# Patient Record
Sex: Female | Born: 1995 | Race: Black or African American | Hispanic: No | Marital: Single | Smoking: Never smoker
Health system: Southern US, Community
[De-identification: ages and names within clinical notes are randomized; demographics above are authoritative.]

---

## 2017-08-15 ENCOUNTER — Emergency Department (HOSPITAL_COMMUNITY)
Admission: EM | Admit: 2017-08-15 | Discharge: 2017-08-16 | Disposition: A | Discharge status: Home / Self Care | Payer: Managed Care, Other (non HMO) | Attending: Emergency Medicine | Admitting: Emergency Medicine

## 2017-08-15 ENCOUNTER — Encounter (HOSPITAL_COMMUNITY): Payer: Self-pay | Admitting: Emergency Medicine

## 2017-08-15 DIAGNOSIS — R102 Pelvic and perineal pain: Secondary | ICD-10-CM | POA: Diagnosis present

## 2017-08-15 DIAGNOSIS — A599 Trichomoniasis, unspecified: Secondary | ICD-10-CM | POA: Insufficient documentation

## 2017-08-15 LAB — I-STAT BETA HCG BLOOD, ED (MC, WL, AP ONLY)

## 2017-08-15 MED ORDER — KETOROLAC TROMETHAMINE 15 MG/ML IJ SOLN
30.0000 mg | Freq: Once | INTRAMUSCULAR | Status: AC
  Administered 2017-08-16: 30 mg via INTRAMUSCULAR
  Filled 2017-08-15: qty 2

## 2017-08-15 NOTE — ED Triage Notes (Signed)
Pt states that she started her period today and is having severe left lower abd/pelvic pain. She states that she took, midol, tylenol, ibuprofen, and used heating pads with no relief of pain.

## 2017-08-15 NOTE — ED Notes (Signed)
Called x3 for triage with no answer 

## 2017-08-16 ENCOUNTER — Encounter (HOSPITAL_COMMUNITY): Payer: Self-pay

## 2017-08-16 ENCOUNTER — Emergency Department (HOSPITAL_COMMUNITY): Payer: Managed Care, Other (non HMO)

## 2017-08-16 ENCOUNTER — Emergency Department (HOSPITAL_COMMUNITY)
Admission: EM | Admit: 2017-08-16 | Discharge: 2017-08-16 | Discharge status: Against Medical Advice | Payer: Managed Care, Other (non HMO) | Source: Home / Self Care | Attending: Emergency Medicine | Admitting: Emergency Medicine

## 2017-08-16 ENCOUNTER — Other Ambulatory Visit: Payer: Self-pay

## 2017-08-16 DIAGNOSIS — R1030 Lower abdominal pain, unspecified: Secondary | ICD-10-CM | POA: Insufficient documentation

## 2017-08-16 DIAGNOSIS — R1032 Left lower quadrant pain: Principal | ICD-10-CM

## 2017-08-16 DIAGNOSIS — A599 Trichomoniasis, unspecified: Secondary | ICD-10-CM

## 2017-08-16 DIAGNOSIS — R1031 Right lower quadrant pain: Secondary | ICD-10-CM

## 2017-08-16 LAB — URINALYSIS, ROUTINE W REFLEX MICROSCOPIC
BILIRUBIN URINE: NEGATIVE
Glucose, UA: NEGATIVE mg/dL
HGB URINE DIPSTICK: NEGATIVE
KETONES UR: 20 mg/dL — AB
Leukocytes, UA: NEGATIVE
Nitrite: NEGATIVE
PROTEIN: NEGATIVE mg/dL
Specific Gravity, Urine: 1.023 (ref 1.005–1.030)
pH: 5 (ref 5.0–8.0)

## 2017-08-16 LAB — WET PREP, GENITAL
CLUE CELLS WET PREP: NONE SEEN
Sperm: NONE SEEN
Yeast Wet Prep HPF POC: NONE SEEN

## 2017-08-16 LAB — GC/CHLAMYDIA PROBE AMP (~~LOC~~) NOT AT ARMC
Chlamydia: NEGATIVE
Neisseria Gonorrhea: NEGATIVE

## 2017-08-16 MED ORDER — METRONIDAZOLE 500 MG PO TABS
2000.0000 mg | ORAL_TABLET | Freq: Once | ORAL | Status: AC
  Administered 2017-08-16: 2000 mg via ORAL
  Filled 2017-08-16: qty 4

## 2017-08-16 MED ORDER — ONDANSETRON 4 MG PO TBDP
8.0000 mg | ORAL_TABLET | Freq: Once | ORAL | Status: AC
  Administered 2017-08-16: 8 mg via ORAL
  Filled 2017-08-16: qty 2

## 2017-08-16 NOTE — ED Provider Notes (Signed)
[facility]   CSN: [id number] Arrival date & time: 08/15/17  2211     History   Chief Complaint Chief Complaint  Patient presents with  . Pelvic Pain    HPI Judy Contreras is a 22 y.o. female.  The history is provided by the patient and medical records. No language interpreter was used.  Pelvic Pain  Associated symptoms include abdominal pain.   Judy Contreras is a 22 y.o. female with no known PMH who presents to the Emergency Department complaining of left-sided crampy abdominal pain.  Patient reports pain is similar to her typical menstrual cycle cramping, however much more severe than usual.  She reports she typically takes Midol and pain subsides.  She took Tylenol, Midol and ibuprofen as well as tried a heating pad, all with no relief pain.  She did start her menstrual cycle about an hour prior to symptom onset.  Denies any nausea or vomiting.  No back pain, dysuria, urinary urgency/frequency or vaginal discharge.  No fever or chills.   History reviewed. No pertinent past medical history.  There are no active problems to display for this patient.   History reviewed. No pertinent surgical history.  OB History    No data available       Home Medications    Prior to Admission medications   Not on File    Family History No family history on file.  Social History Social History   Tobacco Use  . Smoking status: Never Smoker  . Smokeless tobacco: Never Used  Substance Use Topics  . Alcohol use: No    Frequency: Never  . Drug use: Yes    Frequency: 2.0 times per week    Types: Marijuana     Allergies   Patient has no known allergies.   Review of Systems Review of Systems  Gastrointestinal: Positive for abdominal pain. Negative for blood in stool, diarrhea, nausea and vomiting.  Genitourinary: Positive for pelvic pain and vaginal bleeding (On menstrual cycle). Negative for dysuria, frequency,  urgency and vaginal discharge.  All other systems reviewed and are negative.    Physical Exam Updated Vital Signs BP 105/67 (BP Location: Right Arm)   Pulse 75   Temp 98.4 F (36.9 C) (Oral)   Resp 16   Ht 5\' 1"  (1.549 m)   Wt 78 kg (172 lb)   LMP 08/15/2017   SpO2 99%   BMI 32.50 kg/m   Physical Exam  Constitutional: She is oriented to person, place, and time. She appears well-developed and well-nourished. No distress.  HENT:  Head: Normocephalic and atraumatic.  Cardiovascular: Normal rate, regular rhythm and normal heart sounds.  No murmur heard. Pulmonary/Chest: Effort normal and breath sounds normal. No respiratory distress.  Abdominal: Soft. Bowel sounds are normal. She exhibits no distension.  No abdominal or CVA tenderness.   Genitourinary:  Genitourinary Comments: Chaperone present for exam. + menstrual bleeding. No vaginal discharge appreciated, but limited given bleeding. No CMT. + right adnexal tenderness.  Neurological: She is alert and oriented to person, place, and time.  Skin: Skin is warm and dry.  Nursing note and vitals reviewed.    ED Treatments / Results  Labs (all labs ordered are listed, but only abnormal results are displayed) Labs Reviewed  WET PREP, GENITAL - Abnormal; Notable for the following components:      Result Value   Trich, Wet Prep PRESENT (*)    WBC, Wet Prep HPF POC MANY (*)  All other components within normal limits  URINALYSIS, ROUTINE W REFLEX MICROSCOPIC - Abnormal; Notable for the following components:   Ketones, ur 20 (*)    All other components within normal limits  I-STAT BETA HCG BLOOD, ED ([facility], [facility], [facility] ONLY)  GC/CHLAMYDIA PROBE AMP ([place]) NOT AT [facility]    EKG  EKG Interpretation None       Radiology US Transvaginal Non-ob  Result Date: 08/16/2017 CLINICAL DATA:  Lower abdominal/pelvic pain today. EXAM: TRANSABDOMINAL AND TRANSVAGINAL ULTRASOUND OF PELVIS DOPPLER ULTRASOUND OF OVARIES TECHNIQUE: Both  transabdominal and transvaginal ultrasound examinations of the pelvis were performed. Transabdominal technique was performed for global imaging of the pelvis including uterus, ovaries, adnexal regions, and pelvic cul-de-sac. It was necessary to proceed with endovaginal exam following the transabdominal exam to visualize the adnexa. Color and duplex Doppler ultrasound was utilized to evaluate blood flow to the ovaries. COMPARISON:  None. FINDINGS: Uterus Measurements: 6.4 x 3.1 x 3.6 cm. No fibroids or other mass visualized. Endometrium Thickness: 7.5 mm.  No focal abnormality visualized. Right ovary Measurements: 4.3 x 3.1 x 2.9 cm. Normal appearance/no adnexal mass. Left ovary Measurements: 3.7 x 2.4 x 2.7 cm. Normal appearance/no adnexal mass. Pulsed Doppler evaluation of both ovaries demonstrates normal low-resistance arterial and venous waveforms. Other findings Trace free fluid. IMPRESSION: Normal pelvic ultrasound. Electronically Signed   By: Awilda Metro M.D.   On: 08/16/2017 02:01   US Pelvis Complete  Result Date: 08/16/2017 CLINICAL DATA:  Lower abdominal/pelvic pain today. EXAM: TRANSABDOMINAL AND TRANSVAGINAL ULTRASOUND OF PELVIS DOPPLER ULTRASOUND OF OVARIES TECHNIQUE: Both transabdominal and transvaginal ultrasound examinations of the pelvis were performed. Transabdominal technique was performed for global imaging of the pelvis including uterus, ovaries, adnexal regions, and pelvic cul-de-sac. It was necessary to proceed with endovaginal exam following the transabdominal exam to visualize the adnexa. Color and duplex Doppler ultrasound was utilized to evaluate blood flow to the ovaries. COMPARISON:  None. FINDINGS: Uterus Measurements: 6.4 x 3.1 x 3.6 cm. No fibroids or other mass visualized. Endometrium Thickness: 7.5 mm.  No focal abnormality visualized. Right ovary Measurements: 4.3 x 3.1 x 2.9 cm. Normal appearance/no adnexal mass. Left ovary Measurements: 3.7 x 2.4 x 2.7 cm. Normal  appearance/no adnexal mass. Pulsed Doppler evaluation of both ovaries demonstrates normal low-resistance arterial and venous waveforms. Other findings Trace free fluid. IMPRESSION: Normal pelvic ultrasound. Electronically Signed   By: Awilda Metro M.D.   On: 08/16/2017 02:01   [place] Art/ven Flow Abd Pelv Doppler  Result Date: 08/16/2017 CLINICAL DATA:  Lower abdominal/pelvic pain today. EXAM: TRANSABDOMINAL AND TRANSVAGINAL ULTRASOUND OF PELVIS DOPPLER ULTRASOUND OF OVARIES TECHNIQUE: Both transabdominal and transvaginal ultrasound examinations of the pelvis were performed. Transabdominal technique was performed for global imaging of the pelvis including uterus, ovaries, adnexal regions, and pelvic cul-de-sac. It was necessary to proceed with endovaginal exam following the transabdominal exam to visualize the adnexa. Color and duplex Doppler ultrasound was utilized to evaluate blood flow to the ovaries. COMPARISON:  None. FINDINGS: Uterus Measurements: 6.4 x 3.1 x 3.6 cm. No fibroids or other mass visualized. Endometrium Thickness: 7.5 mm.  No focal abnormality visualized. Right ovary Measurements: 4.3 x 3.1 x 2.9 cm. Normal appearance/no adnexal mass. Left ovary Measurements: 3.7 x 2.4 x 2.7 cm. Normal appearance/no adnexal mass. Pulsed Doppler evaluation of both ovaries demonstrates normal low-resistance arterial and venous waveforms. Other findings Trace free fluid. IMPRESSION: Normal pelvic ultrasound. Electronically Signed   By: Awilda Metro M.D.   On: 08/16/2017 02:01  Procedures Procedures (including critical care time)  Medications Ordered in ED Medications  ketorolac (TORADOL) 15 MG/ML injection 30 mg (30 mg Intramuscular Given 08/16/17 0019)  metroNIDAZOLE (FLAGYL) tablet 2,000 mg (2,000 mg Oral Given 08/16/17 0226)     Initial Impression / Assessment and Plan / ED Course  I have reviewed the triage vital signs and the nursing notes.  Pertinent labs & imaging results that were  available during my care of the patient were reviewed by me and considered in my medical decision making (see chart for details).    Judy Contreras is a 22 y.o. female who presents to ED for lower abdominal/pelvic pain which began today.  She did of note also start her menstrual cycle today.  On exam, patient is afebrile, hemodynamically stable with nonsurgical abdomen.  She did have right adnexal tenderness, but no fullness or masses appreciated.  Ultrasound obtained and negative.  UA without signs of infection.  Wet prep with Trichomonas and many WBCs.  Treated in ED with Flagyl.  Discussed symptomatic home care instructions and importance of informing all sexual partners need to be treated. Follow up with OBGYN or health department discussed. Return precautions discussed and all questions.   Final Clinical Impressions(s) / ED Diagnoses   Final diagnoses:  Trichimoniasis    ED Discharge Orders    None       Anddy Wingert, Chase Picket, PA-C 08/16/17 Malva Cogan    Geoffery Lyons, MD 08/16/17 [phone]

## 2017-08-16 NOTE — ED Notes (Signed)
No answer x several tries

## 2017-08-16 NOTE — ED Triage Notes (Signed)
Pt presents today with nausea, vomiting after being seen here yesterday and diagnosed with an STD. The patient states we gave her antibiotics and she went home to take them and has been throwing up every since. States she is severely dehydrated.

## 2017-08-16 NOTE — ED Provider Notes (Addendum)
  Patient placed in Quick Look pathway, seen and evaluated for chief complaint of vomiting after taking 2 g flagyl yesterday for an STD. No treatments prior to arrival. No abdominal pain or diarrhea.   Pertinent H&P findings include Speech is clear and coherent, non-toxic appearing, no respiratory distress.  Normal gait. Mucous membranes moist. Negative pregnancy test yesterday.  Based on initial evaluation, labs are not indicated and radiology studies are not indicated. Will give Zofran and attempt PO trial.  Patient counseled on process, plan, and necessity for staying for completing the evaluation.       Everlene FarrierDansie, Shellby Schlink, PA-C 08/16/17 1403    Azalia Bilisampos, Kevin, MD 08/16/17 [phone]

## 2017-08-16 NOTE — Discharge Instructions (Signed)
°  Use a condom with every sexual encounter Follow up with your doctor or the OBGYN in regards to today's visit.   Please return to the ER for fevers, vomiting, new or worsening symptoms, any additional c

## 2020-01-28 IMAGING — US US TRANSVAGINAL NON-OB
1 series · 14 of 25 positions shown · non-contrast
Comparison: None.

CLINICAL DATA: Lower abdominal/pelvic pain today.

EXAM:
TRANSABDOMINAL AND TRANSVAGINAL ULTRASOUND OF PELVIS
DOPPLER ULTRASOUND OF OVARIES
TECHNIQUE: Both transabdominal and transvaginal ultrasound examinations of the
pelvis were performed. Transabdominal technique was performed for
global imaging of the pelvis including uterus, ovaries, adnexal
regions, and pelvic cul-de-sac.
It was necessary to proceed with endovaginal exam following the
transabdominal exam to visualize the adnexa. Color and duplex
Doppler ultrasound was utilized to evaluate blood flow to the
ovaries.

[Series 1: us transvaginal non-ob · 0.21mm/px · 14 of 76 slices shown]
[im 1/76]
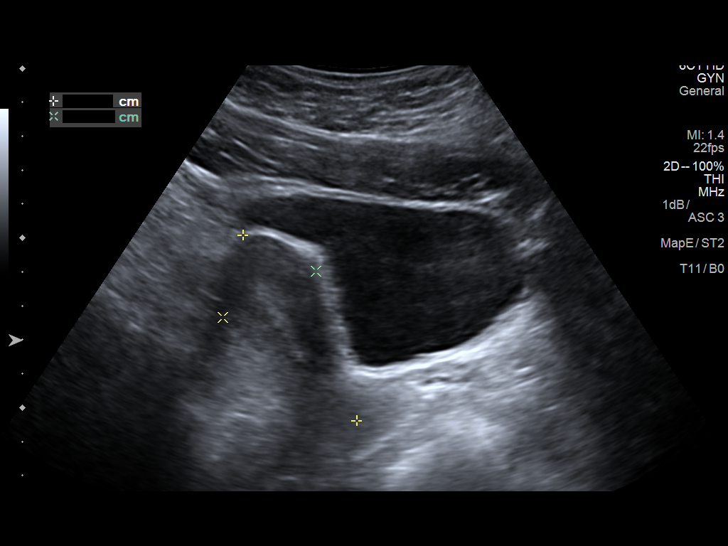
[im 7/76]
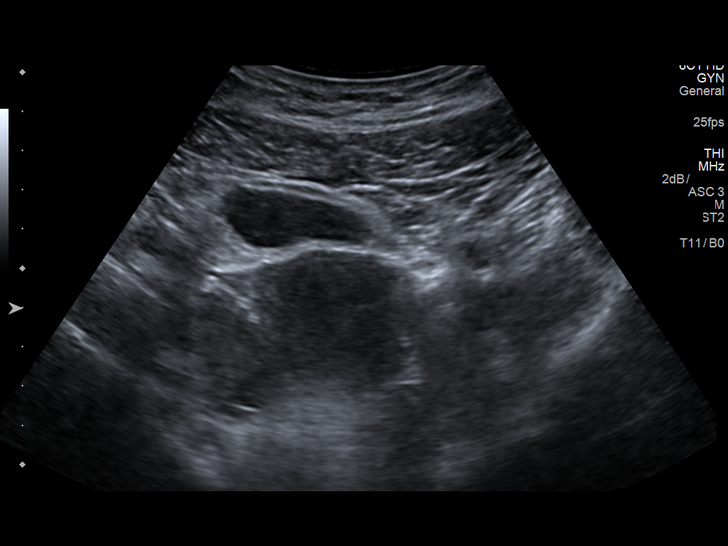
[im 13/76]
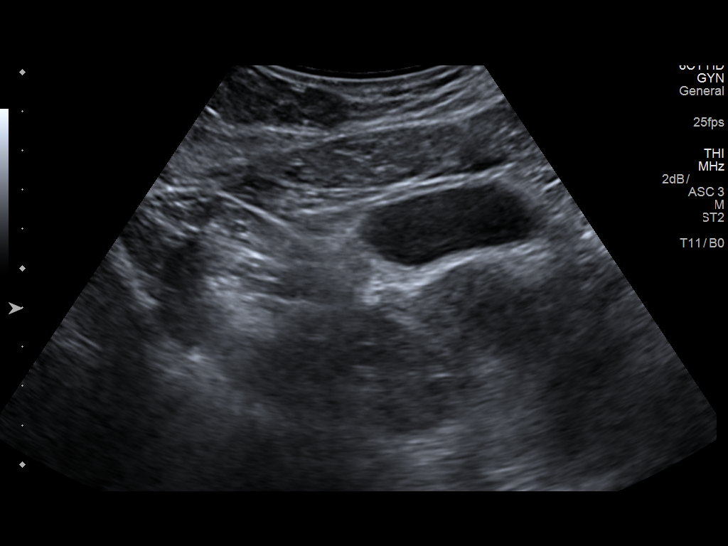
[im 19/76]
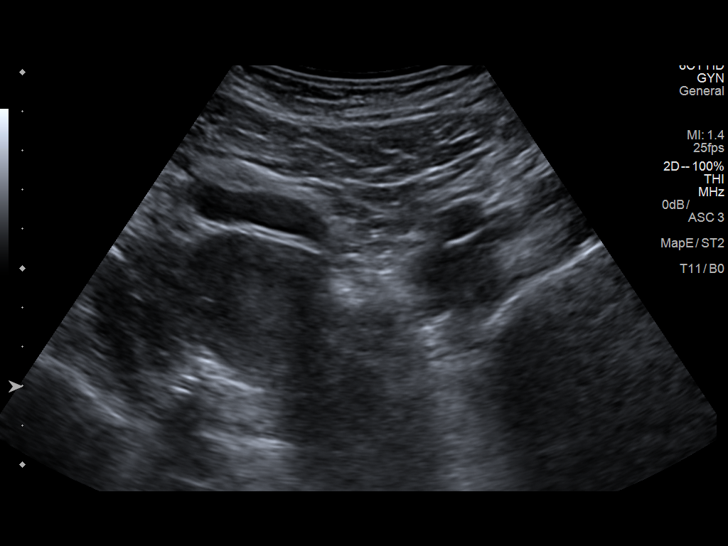
[im 26/76]
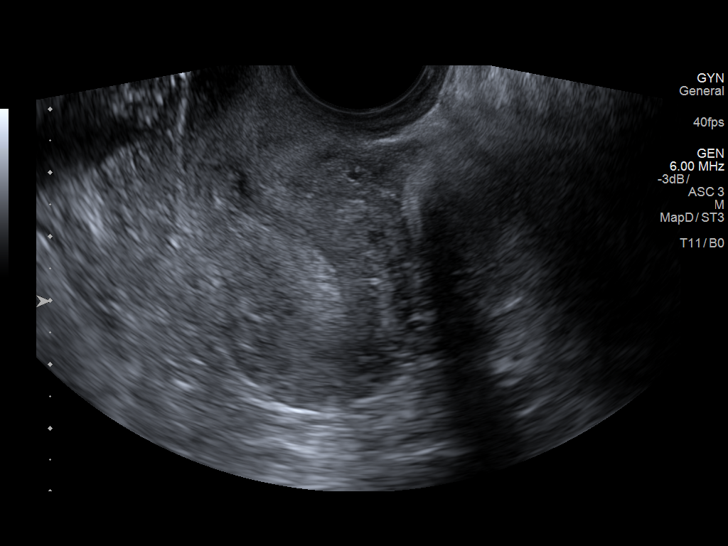
[im 29/76]
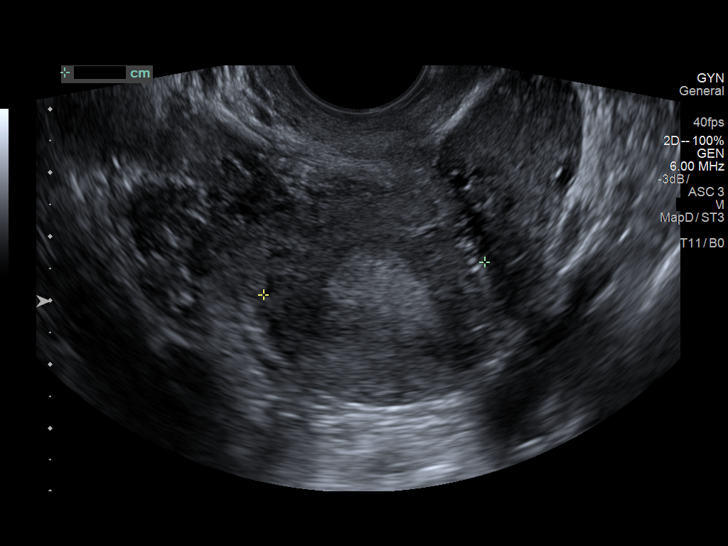
[im 35/76]
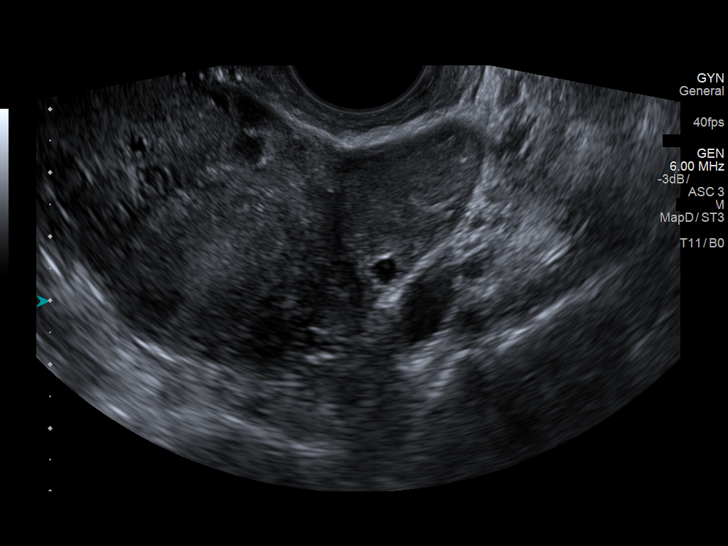
[im 41/76]
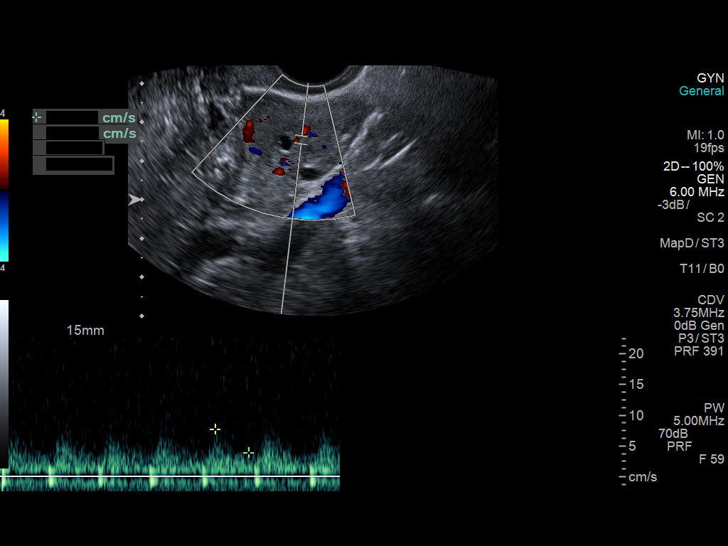
[im 47/76]
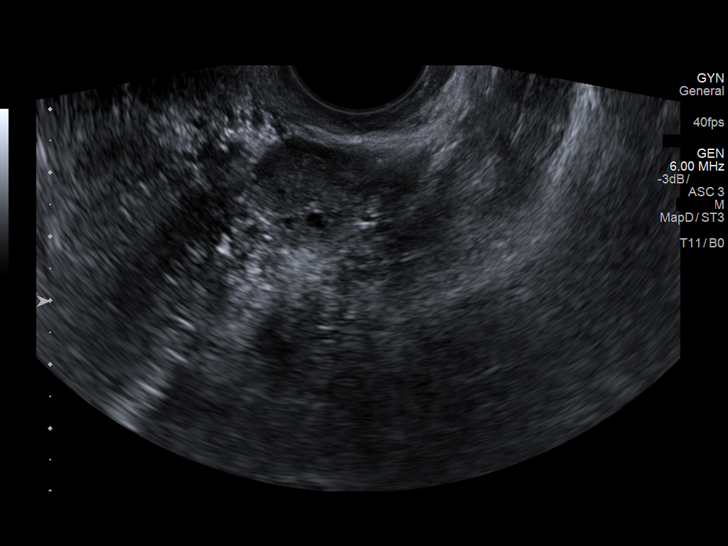
[im 51/76]
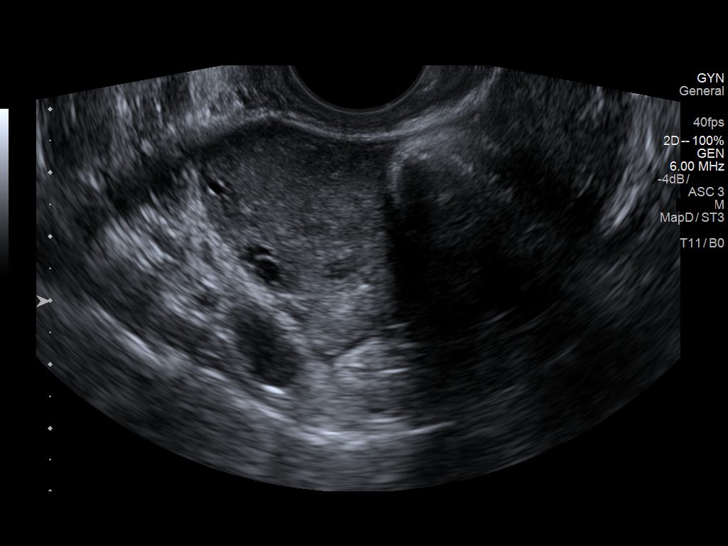
[im 57/76]
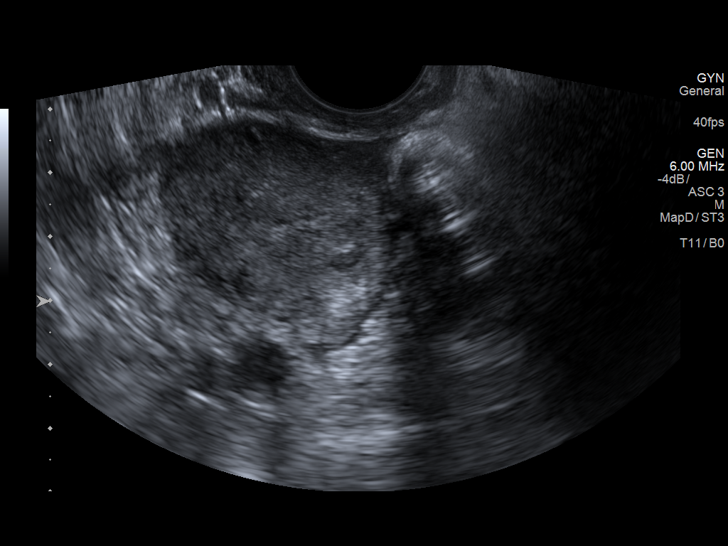
[im 63/76]
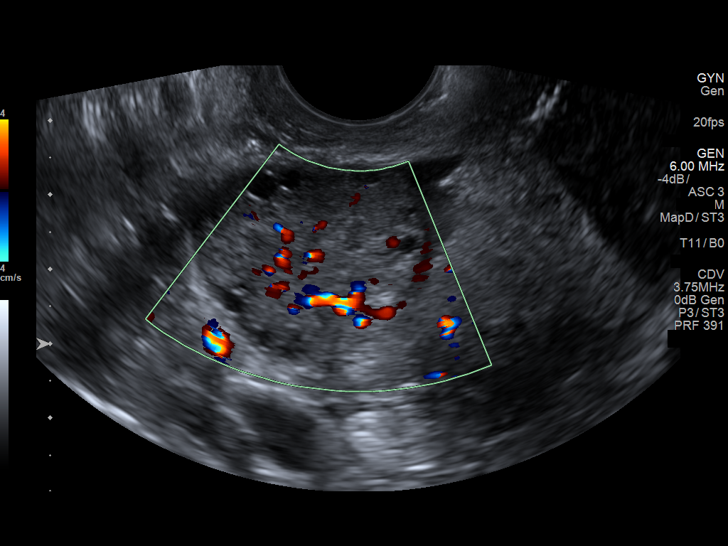
[im 69/76]
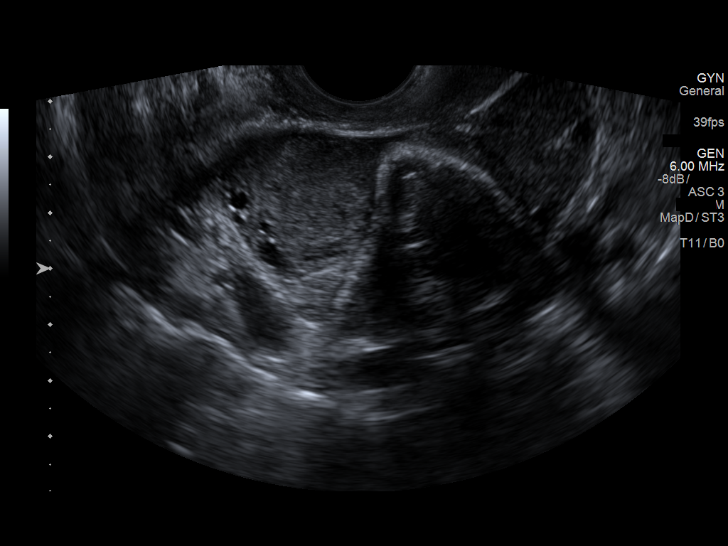
[im 76/76]
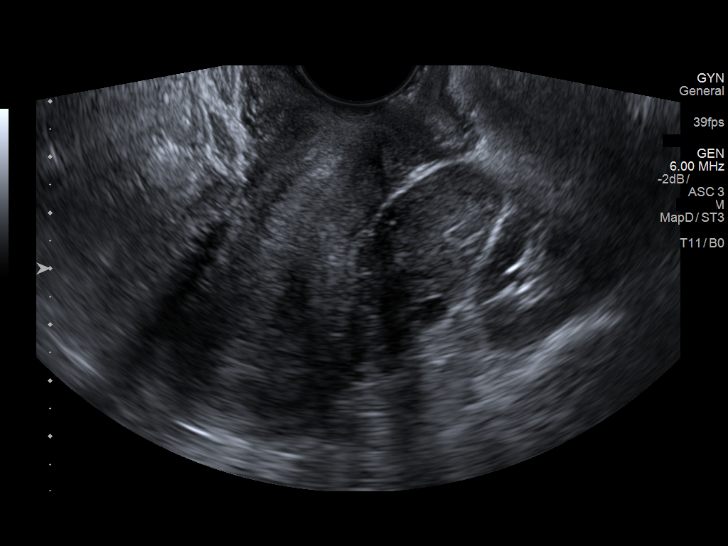

[14 of 25 positions shown; findings below may reference images not displayed]

FINDINGS: Uterus

Measurements: 6.4 x 3.1 x 3.6 cm. No fibroids or other mass
visualized.

Endometrium

Thickness: 7.5 mm.  No focal abnormality visualized.

Right ovary

Measurements: 4.3 x 3.1 x 2.9 cm. Normal appearance/no adnexal mass.

Left ovary

Measurements: 3.7 x 2.4 x 2.7 cm. Normal appearance/no adnexal mass.

Pulsed Doppler evaluation of both ovaries demonstrates normal
low-resistance arterial and venous waveforms.

Other findings

Trace free fluid.
IMPRESSION: Normal pelvic ultrasound.
